# Patient Record
Sex: Female | Born: 1995 | Race: Black or African American | Hispanic: No | Marital: Single | Smoking: Never smoker
Health system: Southern US, Community
[De-identification: ages and names within clinical notes are randomized; demographics above are authoritative.]

## PROBLEM LIST (undated history)

## (undated) DIAGNOSIS — Z6830 Body mass index (BMI) 30.0-30.9, adult: Secondary | ICD-10-CM

## (undated) DIAGNOSIS — Z789 Other specified health status: Secondary | ICD-10-CM

## (undated) HISTORY — DX: Body mass index (BMI) 30.0-30.9, adult: Z68.30

## (undated) HISTORY — PX: NO PAST SURGERIES: SHX2092

---
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
## 2019-08-15 ENCOUNTER — Other Ambulatory Visit: Payer: Self-pay | Admitting: Obstetrics

## 2019-08-15 DIAGNOSIS — N6320 Unspecified lump in the left breast, unspecified quadrant: Secondary | ICD-10-CM

## 2019-08-29 ENCOUNTER — Other Ambulatory Visit: Payer: Self-pay

## 2019-10-02 ENCOUNTER — Ambulatory Visit
Admission: RE | Admit: 2019-10-02 | Discharge: 2019-10-02 | Disposition: A | Discharge status: Home / Self Care | Payer: Medicaid Other | Source: Ambulatory Visit | Attending: Obstetrics | Admitting: Obstetrics

## 2019-10-02 ENCOUNTER — Other Ambulatory Visit: Payer: Self-pay | Admitting: Obstetrics

## 2019-10-02 ENCOUNTER — Other Ambulatory Visit: Payer: Self-pay

## 2019-10-02 DIAGNOSIS — N6320 Unspecified lump in the left breast, unspecified quadrant: Secondary | ICD-10-CM

## 2019-10-15 ENCOUNTER — Other Ambulatory Visit: Payer: Self-pay | Admitting: Obstetrics

## 2019-10-15 ENCOUNTER — Ambulatory Visit
Admission: RE | Admit: 2019-10-15 | Discharge: 2019-10-15 | Disposition: A | Discharge status: Home / Self Care | Payer: Medicaid Other | Source: Ambulatory Visit | Attending: Obstetrics | Admitting: Obstetrics

## 2019-10-15 ENCOUNTER — Other Ambulatory Visit: Payer: Self-pay

## 2019-10-15 DIAGNOSIS — N6320 Unspecified lump in the left breast, unspecified quadrant: Secondary | ICD-10-CM

## 2019-10-30 ENCOUNTER — Other Ambulatory Visit: Payer: Self-pay | Admitting: Surgery

## 2019-12-19 ENCOUNTER — Encounter (HOSPITAL_COMMUNITY): Payer: Self-pay

## 2019-12-19 NOTE — Progress Notes (Signed)
Sent a message in mychart regarding the covid drive- thru location change. 

## 2019-12-25 ENCOUNTER — Encounter (HOSPITAL_BASED_OUTPATIENT_CLINIC_OR_DEPARTMENT_OTHER): Payer: Self-pay | Admitting: Surgery

## 2019-12-25 ENCOUNTER — Other Ambulatory Visit: Payer: Self-pay

## 2019-12-29 ENCOUNTER — Other Ambulatory Visit (HOSPITAL_COMMUNITY)
Admission: RE | Admit: 2019-12-29 | Discharge: 2019-12-29 | Disposition: A | Discharge status: Home / Self Care | Payer: BC Managed Care – PPO | Source: Ambulatory Visit | Attending: Surgery | Admitting: Surgery

## 2019-12-29 DIAGNOSIS — Z01812 Encounter for preprocedural laboratory examination: Secondary | ICD-10-CM | POA: Insufficient documentation

## 2019-12-29 DIAGNOSIS — U071 COVID-19: Secondary | ICD-10-CM | POA: Diagnosis not present

## 2019-12-29 LAB — SARS CORONAVIRUS 2 (TAT 6-24 HRS): SARS Coronavirus 2: POSITIVE — AB

## 2019-12-30 ENCOUNTER — Telehealth: Payer: Self-pay | Admitting: Physician Assistant

## 2019-12-30 ENCOUNTER — Encounter: Payer: Self-pay | Admitting: Physician Assistant

## 2019-12-30 NOTE — Telephone Encounter (Signed)
Called to discuss with patient about Covid symptoms and the use of bamlanivimab/etesevimab or casirivimab/imdevimab, a monoclonal antibody infusion for those with mild to moderate Covid symptoms and at a high risk of hospitalization.  Pt is qualified for this infusion at the [place] infusion center due to [employer] left to call back our hotline [phone] and sent Mychart message.  Angelena Form PA-C  [facility]

## 2019-12-31 NOTE — Progress Notes (Signed)
Patient tested positive for COVID, left message with call back number for Dr. Tamala Fothergill office

## 2019-12-31 NOTE — Progress Notes (Signed)
Called Dr Trevor Mace office and notified Samantha Crimes of patient's positive covid test.Patient will need to be rescheduled per protocol.

## 2020-01-02 ENCOUNTER — Ambulatory Visit (HOSPITAL_BASED_OUTPATIENT_CLINIC_OR_DEPARTMENT_OTHER): Admission: RE | Admit: 2020-01-02 | Payer: Medicaid Other | Source: Home / Self Care | Admitting: Surgery

## 2020-01-02 SURGERY — BREAST LUMPECTOMY
Anesthesia: General | Site: Breast | Laterality: Left

## 2020-01-14 ENCOUNTER — Encounter (HOSPITAL_COMMUNITY): Payer: Self-pay | Admitting: Surgery

## 2020-01-14 ENCOUNTER — Other Ambulatory Visit: Payer: Self-pay

## 2020-01-14 NOTE — Progress Notes (Signed)
Patient was given pre-op instructions over the phone. The opportunity was given for the patient to ask questions. No further questions asked. Patient verbalized understanding of instructions given.   Nothing to eat after midnight.  Patient can have clear liquids until 1215 pm the afternoon of surgery   7 days prior to surgery STOP taking any Aspirin (unless otherwise instructed by your surgeon), Aleve, Naproxen, Ibuprofen, Motrin, Advil, Goody's, BC's, all herbal medications, fish oil, and all vitamins.   Anesthesia Review Needed: NO

## 2020-01-15 MED ORDER — ENSURE PRE-SURGERY PO LIQD: 296.0000 mL | Freq: Once | ORAL | Status: DC

## 2020-01-15 NOTE — H&P (Signed)
   Linda Waters  Location: [facility] Patient #: [id number] DOB: 10-24-1995 Single / Language: Cleophus Molt / Race: Refused to Report/Unreported Female   History of Present Illness  The patient is a 24 year old female who presents with a breast mass.  Chief complaint: Left breast mass  This is a 24 year old female referred here for evaluation of a left breast mass. She noticed it approximately 2 months ago. She feels like it may have gotten larger. She has minimal discomfort from the mass. She denies nipple discharge. She is had no previous problems with her breasts. Her family history is negative for breast cancer. She has no previous surgical history and no cardiopulmonary issues. She is otherwise completely healthy. She underwent an ultrasound of her breast which shows a 4.6 x 4.2 x 2.4 cm mass at the 5 o'clock position of left breast 2 cm from the nipple. 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 Caffeine use  Coffee, Tea. No drug use  Tobacco use  Never smoker.  Family History (Chanel Nolan, CMA;  Diabetes Mellitus  Family Members In General. Hypertension  Family Members In General.  Pregnancy / Birth History ([place], [place]ie;  Age at menarche  23 years. Contraceptive History  Contraceptive implant. Gravida  0  Other Problems (Chanel Nolan, CMA;  Lump In Breast     Review of Systems (Chanel Nolan CMA HEENT Present- Ringing in the Ears. Not Present- Earache, Hearing Loss, Hoarseness, Nose Bleed, Oral Ulcers, Seasonal Allergies, Sinus Pain, Sore Throat, Visual Disturbances, Wears glasses/contact lenses and Yellow  Eyes. Psychiatric Not Present- Anxiety, Bipolar, Change in Sleep Pattern, Depression, Fearful and Frequent crying.  Vitals (Chanel Hatton CMA;  Weight: 163.38 lb Height: 63in Body Surface Area: 1.77 m Body Mass Index: 28.94 kg/m  Temp.: 97.53F  Pulse: 100 (Regular)  BP: 110/72(Sitting, Left Arm, Standard)       Physical Exam The physical exam findings are as follows: Note: On exam, she appears well  Breasts are symmetric bilaterally. The nipple areolar complex is normal. There is a large, mobile, 5 cm mass at the 5 o'clock position of the left breast just under the edge of the areola. There is no adenopathy.    Assessment & Plan   LEFT BREAST MASS (N63.20)  Impression: I have reviewed her left breast ultrasound and notes in the electronic medical records. I have reviewed the pathology results as well. She has a large breast fibroadenoma. I gave her and her mother a copy of the pathology results. We discussed the diagnosis in detail. We discussed proceeding with a left breast lumpectomy to completely remove the large mass versus continued conservative management. As it is quite large and has increased in size, she would like to proceed with surgery which I agree with. I discussed the surgical procedure with them in detail. We discussed the risks which includes but is not limited to bleeding, infection, injury to surrounding structures, developing other fibroadenomas or breast masses, cardiopulmonary issues, postoperative recovery, etc. They understand and wish to proceed with surgery which will be scheduled.

## 2020-01-15 NOTE — Progress Notes (Signed)

## 2020-01-16 ENCOUNTER — Encounter (HOSPITAL_BASED_OUTPATIENT_CLINIC_OR_DEPARTMENT_OTHER)
Admission: RE | Disposition: A | Discharge status: Home / Self Care | Payer: Self-pay | Source: Home / Self Care | Attending: Surgery

## 2020-01-16 ENCOUNTER — Encounter (HOSPITAL_BASED_OUTPATIENT_CLINIC_OR_DEPARTMENT_OTHER): Payer: Self-pay | Admitting: Surgery

## 2020-01-16 ENCOUNTER — Other Ambulatory Visit: Payer: Self-pay

## 2020-01-16 ENCOUNTER — Ambulatory Visit (HOSPITAL_BASED_OUTPATIENT_CLINIC_OR_DEPARTMENT_OTHER): Payer: BC Managed Care – PPO | Admitting: Anesthesiology

## 2020-01-16 ENCOUNTER — Ambulatory Visit (HOSPITAL_BASED_OUTPATIENT_CLINIC_OR_DEPARTMENT_OTHER)
Admission: RE | Admit: 2020-01-16 | Discharge: 2020-01-16 | Disposition: A | Discharge status: Home / Self Care | Payer: BC Managed Care – PPO | Attending: Surgery | Admitting: Surgery

## 2020-01-16 DIAGNOSIS — D242 Benign neoplasm of left breast: Secondary | ICD-10-CM | POA: Diagnosis not present

## 2020-01-16 DIAGNOSIS — N6320 Unspecified lump in the left breast, unspecified quadrant: Secondary | ICD-10-CM | POA: Diagnosis present

## 2020-01-16 HISTORY — PX: BREAST LUMPECTOMY: SHX2

## 2020-01-16 HISTORY — DX: Other specified health status: Z78.9

## 2020-01-16 LAB — POCT PREGNANCY, URINE: Preg Test, Ur: NEGATIVE

## 2020-01-16 SURGERY — BREAST LUMPECTOMY
Anesthesia: General | Site: Breast | Laterality: Left

## 2020-01-16 MED ORDER — ONDANSETRON HCL 4 MG/2ML IJ SOLN
INTRAMUSCULAR | Status: DC | PRN
  Administered 2020-01-16: 4 mg via INTRAVENOUS

## 2020-01-16 MED ORDER — DEXAMETHASONE SODIUM PHOSPHATE 4 MG/ML IJ SOLN
INTRAMUSCULAR | Status: DC | PRN
  Administered 2020-01-16: 5 mg via INTRAVENOUS

## 2020-01-16 MED ORDER — ACETAMINOPHEN 500 MG PO TABS
1000.0000 mg | ORAL_TABLET | ORAL | Status: AC
  Administered 2020-01-16: 1000 mg via ORAL

## 2020-01-16 MED ORDER — PHENYLEPHRINE 40 MCG/ML (10ML) SYRINGE FOR IV PUSH (FOR BLOOD PRESSURE SUPPORT)
PREFILLED_SYRINGE | INTRAVENOUS | Status: AC
  Filled 2020-01-16: qty 10

## 2020-01-16 MED ORDER — FENTANYL CITRATE (PF) 100 MCG/2ML IJ SOLN
INTRAMUSCULAR | Status: DC | PRN
Start: 2020-01-16 — End: 2020-01-16
  Administered 2020-01-16: 25 ug via INTRAVENOUS

## 2020-01-16 MED ORDER — AMISULPRIDE (ANTIEMETIC) 5 MG/2ML IV SOLN: 10.0000 mg | Freq: Once | INTRAVENOUS | Status: DC | PRN

## 2020-01-16 MED ORDER — CELECOXIB 200 MG PO CAPS
400.0000 mg | ORAL_CAPSULE | ORAL | Status: AC
  Administered 2020-01-16: 400 mg via ORAL

## 2020-01-16 MED ORDER — OXYCODONE HCL 5 MG/5ML PO SOLN: 5.0000 mg | Freq: Once | ORAL | Status: DC | PRN

## 2020-01-16 MED ORDER — OXYCODONE HCL 5 MG PO TABS: 5.0000 mg | ORAL_TABLET | Freq: Four times a day (QID) | ORAL | 0 refills | Status: AC | PRN

## 2020-01-16 MED ORDER — FENTANYL CITRATE (PF) 100 MCG/2ML IJ SOLN
INTRAMUSCULAR | Status: AC
  Filled 2020-01-16: qty 2

## 2020-01-16 MED ORDER — ONDANSETRON HCL 4 MG/2ML IJ SOLN
INTRAMUSCULAR | Status: AC
  Filled 2020-01-16: qty 2

## 2020-01-16 MED ORDER — CHLORHEXIDINE GLUCONATE CLOTH 2 % EX PADS: 6.0000 | MEDICATED_PAD | Freq: Once | CUTANEOUS | Status: DC

## 2020-01-16 MED ORDER — PROPOFOL 10 MG/ML IV BOLUS
INTRAVENOUS | Status: DC | PRN
  Administered 2020-01-16: 200 mg via INTRAVENOUS

## 2020-01-16 MED ORDER — LACTATED RINGERS IV SOLN: INTRAVENOUS | Status: DC

## 2020-01-16 MED ORDER — LIDOCAINE 2% (20 MG/ML) 5 ML SYRINGE
INTRAMUSCULAR | Status: AC
  Filled 2020-01-16: qty 5

## 2020-01-16 MED ORDER — OXYCODONE HCL 5 MG PO TABS: 5.0000 mg | ORAL_TABLET | Freq: Once | ORAL | Status: DC | PRN

## 2020-01-16 MED ORDER — PROMETHAZINE HCL 25 MG/ML IJ SOLN: 6.2500 mg | INTRAMUSCULAR | Status: DC | PRN

## 2020-01-16 MED ORDER — MIDAZOLAM HCL 5 MG/5ML IJ SOLN
INTRAMUSCULAR | Status: DC | PRN
  Administered 2020-01-16: 2 mg via INTRAVENOUS

## 2020-01-16 MED ORDER — PROPOFOL 10 MG/ML IV BOLUS
INTRAVENOUS | Status: AC
  Filled 2020-01-16: qty 20

## 2020-01-16 MED ORDER — CELECOXIB 200 MG PO CAPS
ORAL_CAPSULE | ORAL | Status: AC
  Filled 2020-01-16: qty 2

## 2020-01-16 MED ORDER — ACETAMINOPHEN 500 MG PO TABS
ORAL_TABLET | ORAL | Status: AC
  Filled 2020-01-16: qty 2

## 2020-01-16 MED ORDER — CEFAZOLIN SODIUM-DEXTROSE 2-4 GM/100ML-% IV SOLN
2.0000 g | INTRAVENOUS | Status: AC
  Administered 2020-01-16: 2 g via INTRAVENOUS

## 2020-01-16 MED ORDER — BUPIVACAINE-EPINEPHRINE 0.5% -1:200000 IJ SOLN
INTRAMUSCULAR | Status: DC | PRN
  Administered 2020-01-16: 11 mL

## 2020-01-16 MED ORDER — MIDAZOLAM HCL 2 MG/2ML IJ SOLN
INTRAMUSCULAR | Status: AC
  Filled 2020-01-16: qty 2

## 2020-01-16 MED ORDER — GABAPENTIN 300 MG PO CAPS
ORAL_CAPSULE | ORAL | Status: AC
  Filled 2020-01-16: qty 1

## 2020-01-16 MED ORDER — MEPERIDINE HCL 25 MG/ML IJ SOLN: 6.2500 mg | INTRAMUSCULAR | Status: DC | PRN

## 2020-01-16 MED ORDER — LIDOCAINE HCL (CARDIAC) PF 100 MG/5ML IV SOSY
PREFILLED_SYRINGE | INTRAVENOUS | Status: DC | PRN
  Administered 2020-01-16: 60 mg via INTRAVENOUS

## 2020-01-16 MED ORDER — HYDROMORPHONE HCL 1 MG/ML IJ SOLN: 0.2500 mg | INTRAMUSCULAR | Status: DC | PRN

## 2020-01-16 MED ORDER — CEFAZOLIN SODIUM-DEXTROSE 2-4 GM/100ML-% IV SOLN
INTRAVENOUS | Status: AC
  Filled 2020-01-16: qty 100

## 2020-01-16 MED ORDER — GABAPENTIN 300 MG PO CAPS
300.0000 mg | ORAL_CAPSULE | ORAL | Status: AC
  Administered 2020-01-16: 300 mg via ORAL

## 2020-01-16 MED ORDER — DEXAMETHASONE SODIUM PHOSPHATE 10 MG/ML IJ SOLN
INTRAMUSCULAR | Status: AC
  Filled 2020-01-16: qty 3

## 2020-01-16 SURGICAL SUPPLY — 39 items
ADH SKN CLS APL DERMABOND .7 (GAUZE/BANDAGES/DRESSINGS) ×1
APL PRP STRL LF DISP 70% ISPRP (MISCELLANEOUS) ×1
BLADE SURG 15 STRL LF DISP TIS (BLADE) ×1 IMPLANT
BLADE SURG 15 STRL SS (BLADE) ×2
CANISTER SUCT 1200ML W/VALVE (MISCELLANEOUS) IMPLANT
CHLORAPREP W/TINT 26 (MISCELLANEOUS) ×2 IMPLANT
CLIP VESOCCLUDE SM WIDE 6/CT (CLIP) IMPLANT
COVER BACK TABLE 60X90IN (DRAPES) ×2 IMPLANT
COVER MAYO STAND STRL (DRAPES) ×2 IMPLANT
COVER WAND RF STERILE (DRAPES) IMPLANT
DECANTER SPIKE VIAL GLASS SM (MISCELLANEOUS) ×2 IMPLANT
DERMABOND ADVANCED (GAUZE/BANDAGES/DRESSINGS) ×1
DERMABOND ADVANCED .7 DNX12 (GAUZE/BANDAGES/DRESSINGS) ×1 IMPLANT
DRAPE LAPAROTOMY 100X72 PEDS (DRAPES) ×2 IMPLANT
DRAPE UTILITY XL STRL (DRAPES) ×2 IMPLANT
ELECT REM PT RETURN 9FT ADLT (ELECTROSURGICAL) ×2
ELECTRODE REM PT RTRN 9FT ADLT (ELECTROSURGICAL) ×1 IMPLANT
GAUZE SPONGE 4X4 12PLY STRL LF (GAUZE/BANDAGES/DRESSINGS) ×2 IMPLANT
GLOVE SURG SIGNA 7.5 PF LTX (GLOVE) ×2 IMPLANT
GOWN STRL REUS W/ TWL LRG LVL3 (GOWN DISPOSABLE) ×2 IMPLANT
GOWN STRL REUS W/ TWL XL LVL3 (GOWN DISPOSABLE) ×1 IMPLANT
GOWN STRL REUS W/TWL LRG LVL3 (GOWN DISPOSABLE) ×4
GOWN STRL REUS W/TWL XL LVL3 (GOWN DISPOSABLE) ×2
KIT MARKER MARGIN INK (KITS) IMPLANT
NEEDLE HYPO 25X1 1.5 SAFETY (NEEDLE) ×2 IMPLANT
NS IRRIG 1000ML POUR BTL (IV SOLUTION) ×2 IMPLANT
PACK BASIN DAY SURGERY FS (CUSTOM PROCEDURE TRAY) ×2 IMPLANT
PENCIL SMOKE EVACUATOR (MISCELLANEOUS) ×2 IMPLANT
SLEEVE SCD COMPRESS KNEE MED (MISCELLANEOUS) ×2 IMPLANT
SPONGE LAP 4X18 RFD (DISPOSABLE) ×2 IMPLANT
SUT MNCRL AB 4-0 PS2 18 (SUTURE) ×2 IMPLANT
SUT SILK 2 0 SH (SUTURE) IMPLANT
SUT VIC AB 3-0 SH 27 (SUTURE) ×2
SUT VIC AB 3-0 SH 27X BRD (SUTURE) ×1 IMPLANT
SYR CONTROL 10ML LL (SYRINGE) ×2 IMPLANT
TOWEL GREEN STERILE FF (TOWEL DISPOSABLE) ×2 IMPLANT
TRAY FAXITRON CT DISP (TRAY / TRAY PROCEDURE) IMPLANT
TUBE CONNECTING 20X1/4 (TUBING) IMPLANT
YANKAUER SUCT BULB TIP NO VENT (SUCTIONS) IMPLANT

## 2020-01-16 NOTE — Discharge Instructions (Signed)
[place] Office Phone Number [phone]  BREAST BIOPSY/ PARTIAL MASTECTOMY: POST OP INSTRUCTIONS  Always review your discharge instruction sheet given to you by the facility where your surgery was performed.  IF YOU HAVE DISABILITY OR FAMILY LEAVE FORMS, YOU MUST BRING THEM TO THE OFFICE FOR PROCESSING.  DO NOT GIVE THEM TO YOUR DOCTOR.  1. A prescription for pain medication may be given to you upon discharge.  Take your pain medication as prescribed, if needed.  If narcotic pain medicine is not needed, then you may take acetaminophen (Tylenol) or ibuprofen (Advil) as needed. *No tylenol until 5:15pm *No Ibuprofen until 7:15pm 2. Take your usually prescribed medications unless otherwise directed 3. If you need a refill on your pain medication, please contact your pharmacy.  They will contact our office to request authorization.  Prescriptions will not be filled after 5pm or on week-ends. 4. You should eat very light the first 24 hours after surgery, such as soup, crackers, pudding, etc.  Resume your normal diet the day after surgery. 5. Most patients will experience some swelling and bruising in the breast.  Ice packs and a good support bra will help.  Swelling and bruising can take several days to resolve.  6. It is common to experience some constipation if taking pain medication after surgery.  Increasing fluid intake and taking a stool softener will usually help or prevent this problem from occurring.  A mild laxative (Milk of Magnesia or Miralax) should be taken according to package directions if there are no bowel movements after 48 hours. 7. Unless discharge instructions indicate otherwise, you may remove your bandages 24-48 hours after surgery, and you may shower at that time.  You may have steri-strips (small skin tapes) in place directly over the incision.  These strips should be left on the skin for 7-10 days.  If your surgeon used skin glue on the incision, you may  shower in 24 hours.  The glue will flake off over the next 2-3 weeks.  Any sutures or staples will be removed at the office during your follow-up visit. 8. ACTIVITIES:  You may resume regular daily activities (gradually increasing) beginning the next day.  Wearing a good support bra or sports bra minimizes pain and swelling.  You may have sexual intercourse when it is comfortable. a. You may drive when you no longer are taking prescription pain medication, you can comfortably wear a seatbelt, and you can safely maneuver your car and apply brakes. b. RETURN TO WORK:  ______________________________________________________________________________________ 9. You should see your doctor in the office for a follow-up appointment approximately two weeks after your surgery.  Your doctor's nurse will typically make your follow-up appointment when she calls you with your pathology report.  Expect your pathology report 2-3 business days after your surgery.  You may call to check if you do not hear from [place] after three days. 10. OTHER INSTRUCTIONS:OK TO SHOWER STARTING TOMORROW 11. ICE PACK, TYLENOL, AND IBUPROFEN ALSO FOR PAIN 12. NO VIGOROUS ACTIVITY FOR ONE WEEK _______________________________________________________________________________________________ _____________________________________________________________________________________________________________________________________ _____________________________________________________________________________________________________________________________________ _____________________________________________________________________________________________________________________________________  WHEN TO CALL YOUR DOCTOR: 1. Fever over 101.0 2. Nausea and/or vomiting. 3. Extreme swelling or bruising. 4. Continued bleeding from incision. 5. Increased pain, redness, or drainage from the incision.  The clinic staff is available to answer your questions  during regular business hours.  Please don't hesitate to call and ask to speak to one of the nurses for clinical concerns.  If you have a medical emergency, go to the  nearest emergency room or call 911.  A surgeon from [facility] is always on call at the hospital.  For further questions, please visit [email or web address]    Post Anesthesia Home Care Instructions  Activity: Get plenty of rest for the remainder of the day. A responsible individual must stay with you for 24 hours following the procedure.  For the next 24 hours, DO NOT: -Drive a car -Paediatric nurse -Drink alcoholic beverages -Take any medication unless instructed by your physician -Make any legal decisions or sign important papers.  Meals: Start with liquid foods such as gelatin or soup. Progress to regular foods as tolerated. Avoid greasy, spicy, heavy foods. If nausea and/or vomiting occur, drink only clear liquids until the nausea and/or vomiting subsides. Call your physician if vomiting continues.  Special Instructions/Symptoms: Your throat may feel dry or sore from the anesthesia or the breathing tube placed in your throat during surgery. If this causes discomfort, gargle with warm salt water. The discomfort should disappear within 24 hours.  If you had a scopolamine patch placed behind your ear for the management of post- operative nausea and/or vomiting:  1. The medication in the patch is effective for 72 hours, after which it should be removed.  Wrap patch in a tissue and discard in the trash. Wash hands thoroughly with soap and water. 2. You may remove the patch earlier than 72 hours if you experience unpleasant side effects which may include dry mouth, dizziness or visual disturbances. 3. Avoid touching the patch. Wash your hands with soap and water after contact with the patch.

## 2020-01-16 NOTE — Anesthesia Procedure Notes (Signed)
Procedure Name: LMA Insertion Date/Time: 01/16/2020 12:06 PM Performed by: Lavonia Dana, CRNA Pre-anesthesia Checklist: Patient identified, Emergency Drugs available, Suction available and Patient being monitored Patient Re-evaluated:Patient Re-evaluated prior to induction Oxygen Delivery Method: Circle system utilized Preoxygenation: Pre-oxygenation with 100% oxygen Induction Type: IV induction Ventilation: Mask ventilation without difficulty LMA: LMA inserted LMA Size: 4.0 Number of attempts: 1 Airway Equipment and Method: Bite block Placement Confirmation: positive ETCO2 Tube secured with: Tape Dental Injury: Teeth and Oropharynx as per pre-operative assessment

## 2020-01-16 NOTE — Anesthesia Preprocedure Evaluation (Signed)
Anesthesia Evaluation  Patient identified by MRN, date of birth, ID band Patient awake    Reviewed: Allergy & Precautions, NPO status , Patient's Chart, lab work & pertinent test results  Airway Mallampati: II  TM Distance: >3 FB Neck ROM: Full    Dental no notable dental hx.    Pulmonary neg pulmonary ROS,    Pulmonary exam normal breath sounds clear to auscultation       Cardiovascular negative cardio ROS Normal cardiovascular exam Rhythm:Regular Rate:Normal     Neuro/Psych negative neurological ROS  negative psych ROS   GI/Hepatic negative GI ROS, Neg liver ROS,   Endo/Other  negative endocrine ROS  Renal/GU negative Renal ROS  negative genitourinary   Musculoskeletal negative musculoskeletal ROS (+)   Abdominal (+) + obese,   Peds negative pediatric ROS (+)  Hematology negative hematology ROS (+)   Anesthesia Other Findings   Reproductive/Obstetrics negative OB ROS                             Anesthesia Physical Anesthesia Plan  ASA: II  Anesthesia Plan: General   Post-op Pain Management:    Induction: Intravenous  PONV Risk Score and Plan: 3 and Ondansetron, Dexamethasone, Midazolam and Treatment may vary due to age or medical condition  Airway Management Planned: LMA  Additional Equipment:   Intra-op Plan:   Post-operative Plan: Extubation in OR  Informed Consent: I have reviewed the patients History and Physical, chart, labs and discussed the procedure including the risks, benefits and alternatives for the proposed anesthesia with the patient or authorized representative who has indicated his/her understanding and acceptance.     Dental advisory given  Plan Discussed with: CRNA  Anesthesia Plan Comments:         Anesthesia Quick Evaluation

## 2020-01-16 NOTE — Transfer of Care (Signed)
Immediate Anesthesia Transfer of Care Note  Patient: Linda Waters  Procedure(s) Performed: LEFT BREAST LUMPECTOMY (Left Breast)  Patient Location: PACU  Anesthesia Type:General  Level of Consciousness: awake and alert   Airway & Oxygen Therapy: Patient Spontanous Breathing and Patient connected to nasal cannula oxygen  Post-op Assessment: Report given to RN and Post -op Vital signs reviewed and stable  Post vital signs: Reviewed and stable  Last Vitals:  Vitals Value Taken Time  BP 100/59 01/16/20 1242  Temp    Pulse 64 01/16/20 1244  Resp 13 01/16/20 1244  SpO2 100 % 01/16/20 1244  Vitals shown include unvalidated device data.  Last Pain:  Vitals:   01/16/20 1117  TempSrc: Oral  PainSc: 0-No pain         Complications: No complications documented.

## 2020-01-16 NOTE — Interval H&P Note (Signed)
History and Physical Interval Note: no change in H and P  01/16/2020 11:48 AM  Linda Waters  has presented today for surgery, with the diagnosis of LEFT BREAST MASS.  The various methods of treatment have been discussed with the patient and family. After consideration of risks, benefits and other options for treatment, the patient has consented to  Procedure(s) with comments: LEFT BREAST LUMPECTOMY (Left) - LMA as a surgical intervention.  The patient's history has been reviewed, patient examined, no change in status, stable for surgery.  I have reviewed the patient's chart and labs.  Questions were answered to the patient's satisfaction.     Coralie Keens

## 2020-01-16 NOTE — Anesthesia Postprocedure Evaluation (Signed)
Anesthesia Post Note  Patient: Linda Waters  Procedure(s) Performed: LEFT BREAST LUMPECTOMY (Left Breast)     Patient location during evaluation: PACU Anesthesia Type: General Level of consciousness: awake and alert Pain management: pain level controlled Vital Signs Assessment: post-procedure vital signs reviewed and stable Respiratory status: spontaneous breathing, nonlabored ventilation and respiratory function stable Cardiovascular status: blood pressure returned to baseline and stable Postop Assessment: no apparent nausea or vomiting Anesthetic complications: no   No complications documented.  Last Vitals:  Vitals:   01/16/20 1315 01/16/20 1340  BP: 107/74 91/79  Pulse: 71 60  Resp: 14 18  Temp:  36.7 C  SpO2: 100% 100%    Last Pain:  Vitals:   01/16/20 1340  TempSrc: Oral  PainSc: 0-No pain                 [name]

## 2020-01-16 NOTE — Op Note (Signed)
LEFT BREAST LUMPECTOMY  Procedure Note  Linda Waters 01/16/2020   Pre-op Diagnosis: LEFT BREAST MASS     Post-op Diagnosis: same  Procedure(s): LEFT BREAST LUMPECTOMY  Surgeon(s): Coralie Keens, MD Carlena Hurl, PA-C  Anesthesia: General  Staff:  Circulator: McDonough-Hughes, Delene Ruffini, RN Relief Circulator: Carson Myrtle, RN Relief Scrub: Jackie Plum  Estimated Blood Loss: Minimal               Specimens: sent to path  Indications: This is a 24 year old female with a large left breast mass measuring greater than 5 cm.  She has had a biopsy showing this to be a giant fibroadenoma.  Decision was made to proceed with lumpectomy  Procedure: The patient was brought to the operating room and identifies correct patient.  She is placed upon the operating table general anesthesia was induced.  Her left breast was prepped and draped in usual sterile fashion.  I anesthetized the skin at the lower edge of the left areola with Marcaine.  I then made a transverse incision with a scalpel.  I then dissected down to the very large mass which was easily identified.  It appeared consistent with a large fibroadenoma.  I excised it in its entirety with the electrocautery.  The mass was then sent to pathology for evaluation.  I then achieved hemostasis with the cautery.  I anesthetized the incision further with Marcaine.  I then closed the subcutaneous tissue with interrupted 3-0 Vicryl sutures and closed the skin with a running 4-0 Monocryl.  Dermabond was then applied.  The patient tolerated the procedure well.  All the counts were correct at the end of the procedure.  The patient was then extubated in the operating room and taken in a stable condition to the recovery room.          Coralie Keens   Date: 01/16/2020  Time: 12:28 PM

## 2020-01-17 ENCOUNTER — Encounter (HOSPITAL_BASED_OUTPATIENT_CLINIC_OR_DEPARTMENT_OTHER): Payer: Self-pay | Admitting: Surgery

## 2020-01-18 LAB — SURGICAL PATHOLOGY

## 2020-11-01 IMAGING — US US BREAST*L* LIMITED INC AXILLA
1 series · 10 of 10 positions shown · non-contrast
Comparison: None.

CLINICAL DATA: Mass felt by the patient in the 5 o'clock position
of the left breast for the past 4 months. She thinks the mass may
have gotten larger but is not sure. No family history of breast
cancer.

EXAM:
ULTRASOUND OF THE LEFT BREAST

[Series 1: us breast*left* limited inc axilla · 0.07mm/px · 10 of 10 slices shown]
[im 1/10]
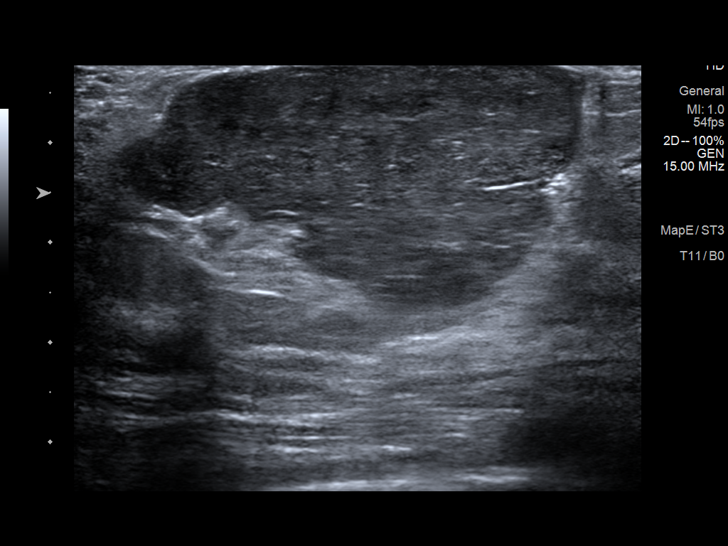
[im 2/10]
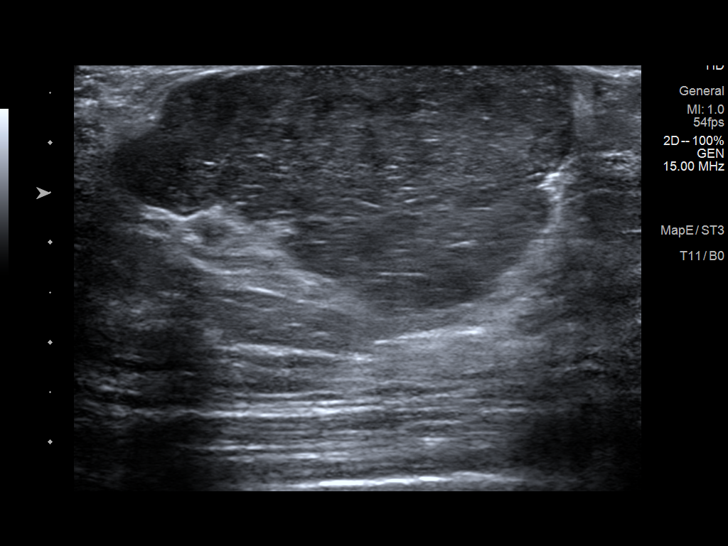
[im 3/10]
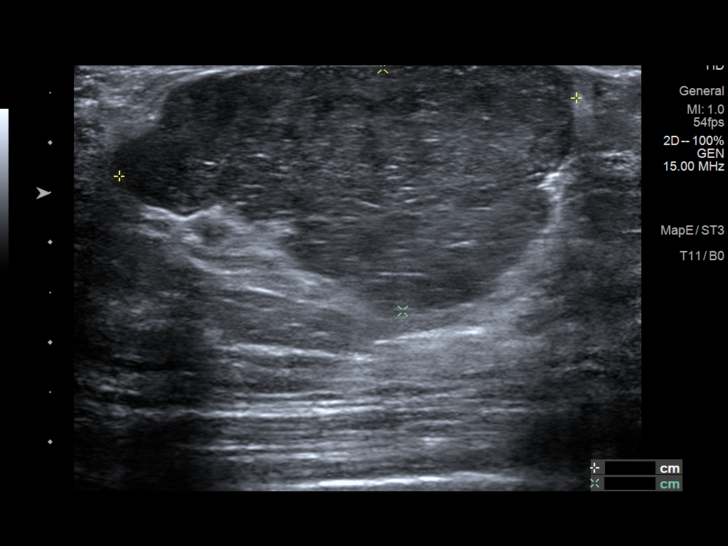
[im 4/10]
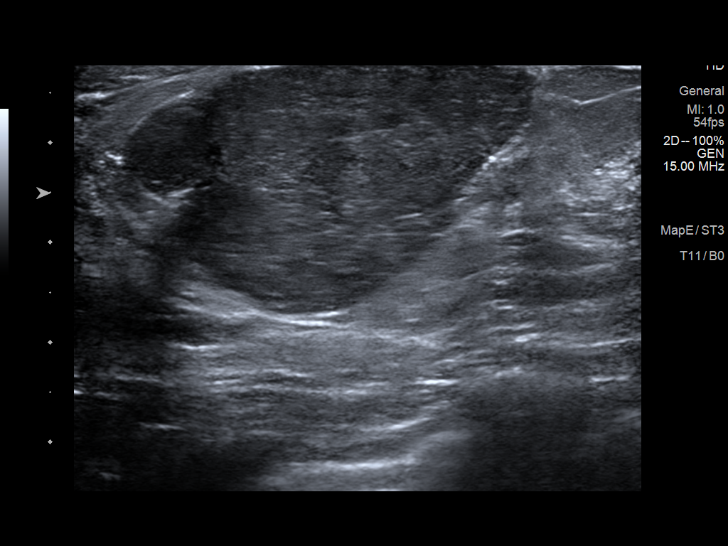
[im 5/10]
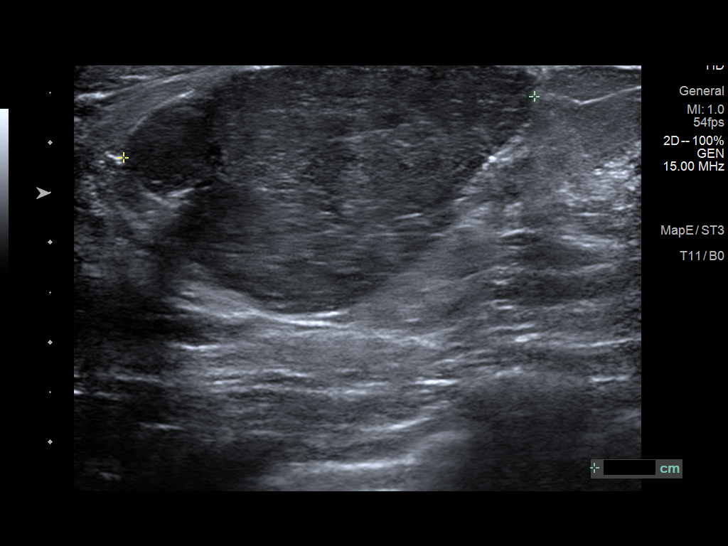
[im 6/10]
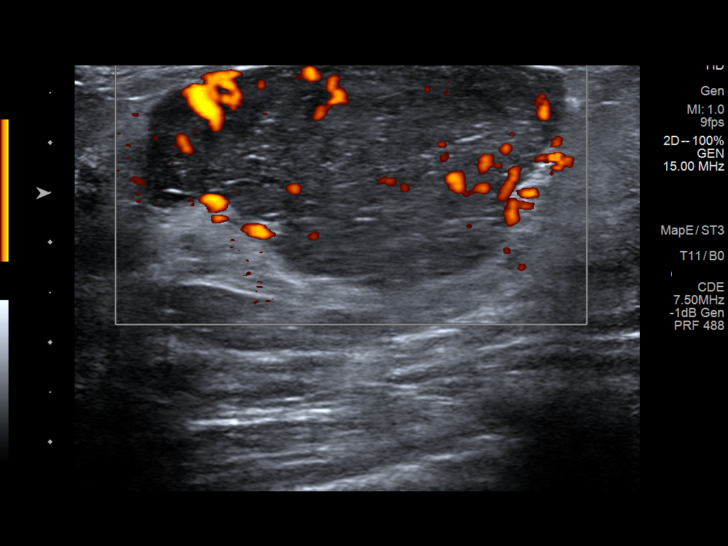
[im 7/10]
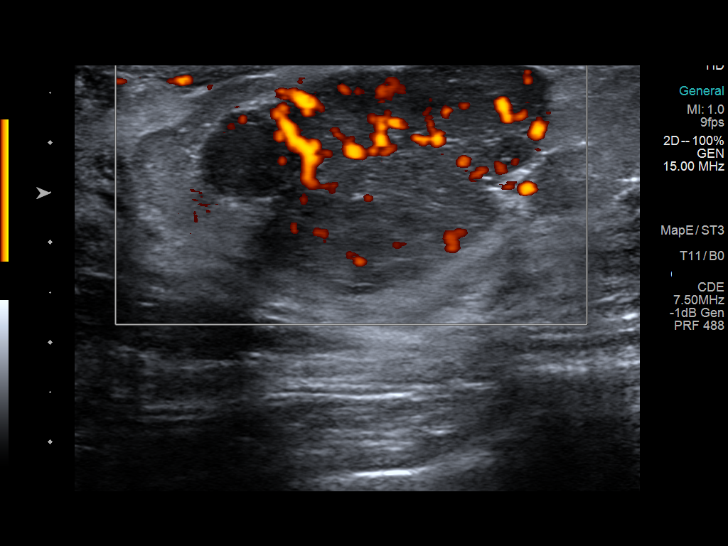
[im 8/10]
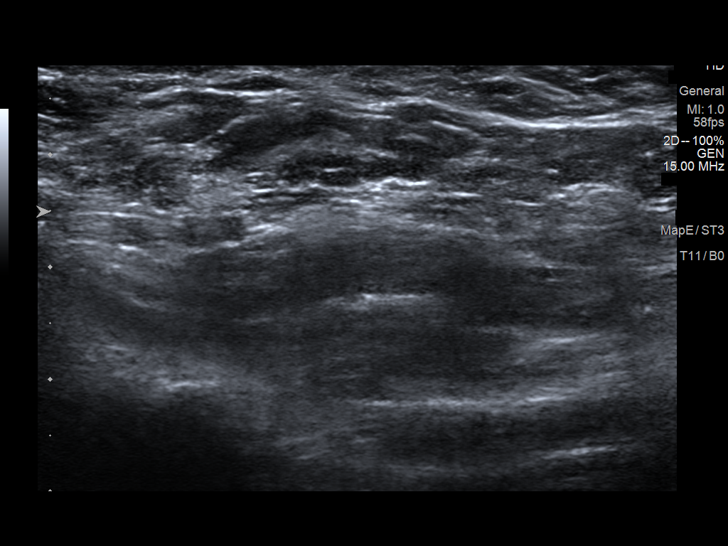
[im 9/10]
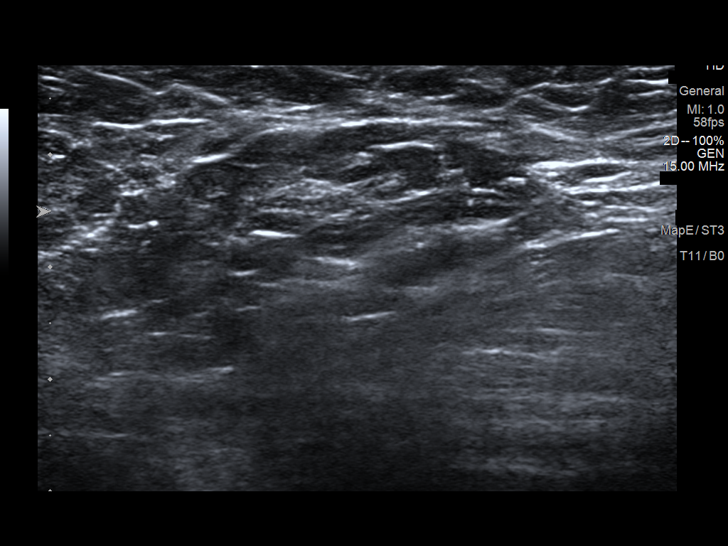
[im 10/10]
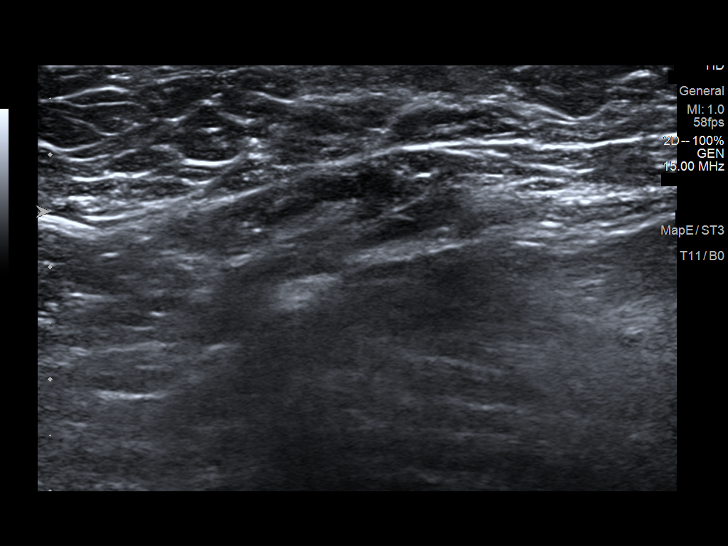

[10 of 10 positions shown; findings below may reference images not displayed]

FINDINGS: On physical exam, the patient has an approximately 4 x 2 cm oval,
circumscribed, mobile, palpable mass in the 5 o'clock position of
the left breast, 2 cm from the nipple. There are no palpable left
axillary lymph nodes.

Targeted ultrasound is performed, showing a 4.6 x 4.2 x 2.4 cm oval,
lobulated, horizontally oriented, circumscribed, mildly hypoechoic
mass in the 5 o'clock position of the left breast, 2 cm from the
nipple. This has prominent internal blood flow with power Doppler.
This also contains thin partial internal septations.

Ultrasound of the left axilla demonstrated normal appearing left
axillary lymph nodes.
IMPRESSION: 4.6 cm probable fibroadenoma in the 5 o'clock position of the left
breast.

RECOMMENDATION:
Ultrasound-guided core needle biopsy of the 4.6 cm mass in 5 o'clock
position of the left breast. This has been discussed with the
patient and scheduled at [DATE] a.m. on 10/15/2019. The probable need
for surgical excision was also discussed with the patient.

I have discussed the findings and recommendations with the patient.
If applicable, a reminder letter will be sent to the patient
regarding the next appointment.

BI-RADS CATEGORY  4: Suspicious.
